# Patient Record
Sex: Female | Born: 1970 | Race: Asian | Hispanic: No | Marital: Married | Smoking: Never smoker
Health system: Southern US, Community
[De-identification: ages and names within clinical notes are randomized; demographics above are authoritative.]

## PROBLEM LIST (undated history)

## (undated) DIAGNOSIS — T7840XA Allergy, unspecified, initial encounter: Secondary | ICD-10-CM

## (undated) HISTORY — DX: Allergy, unspecified, initial encounter: T78.40XA

## (undated) HISTORY — PX: TUBAL LIGATION: SHX77

---

## 2001-09-16 ENCOUNTER — Other Ambulatory Visit: Admission: RE | Admit: 2001-09-16 | Discharge: 2001-09-16 | Payer: Self-pay | Admitting: Gynecology

## 2003-02-23 ENCOUNTER — Other Ambulatory Visit: Admission: RE | Admit: 2003-02-23 | Discharge: 2003-02-23 | Payer: Self-pay | Admitting: Gynecology

## 2003-07-04 ENCOUNTER — Encounter: Admission: RE | Admit: 2003-07-04 | Discharge: 2003-07-04 | Payer: Self-pay | Admitting: Gynecology

## 2003-09-17 ENCOUNTER — Inpatient Hospital Stay (HOSPITAL_COMMUNITY): Admission: AD | Admit: 2003-09-17 | Discharge: 2003-09-19 | Payer: Self-pay | Admitting: Gynecology

## 2003-11-01 ENCOUNTER — Other Ambulatory Visit: Admission: RE | Admit: 2003-11-01 | Discharge: 2003-11-01 | Payer: Self-pay | Admitting: Gynecology

## 2004-11-06 ENCOUNTER — Other Ambulatory Visit: Admission: RE | Admit: 2004-11-06 | Discharge: 2004-11-06 | Payer: Self-pay | Admitting: Gynecology

## 2005-06-11 ENCOUNTER — Other Ambulatory Visit: Admission: RE | Admit: 2005-06-11 | Discharge: 2005-06-11 | Payer: Self-pay | Admitting: Surgical Oncology

## 2005-09-23 ENCOUNTER — Encounter: Admission: RE | Admit: 2005-09-23 | Discharge: 2005-09-23 | Payer: Self-pay | Admitting: Gynecology

## 2005-12-30 ENCOUNTER — Inpatient Hospital Stay (HOSPITAL_COMMUNITY): Admission: AD | Admit: 2005-12-30 | Discharge: 2006-01-01 | Payer: Self-pay | Admitting: Gynecology

## 2006-02-10 ENCOUNTER — Other Ambulatory Visit: Admission: RE | Admit: 2006-02-10 | Discharge: 2006-02-10 | Payer: Self-pay | Admitting: Gynecology

## 2006-03-10 ENCOUNTER — Ambulatory Visit (HOSPITAL_BASED_OUTPATIENT_CLINIC_OR_DEPARTMENT_OTHER): Admission: RE | Admit: 2006-03-10 | Discharge: 2006-03-10 | Payer: Self-pay | Admitting: Gynecology

## 2014-05-10 ENCOUNTER — Ambulatory Visit (INDEPENDENT_AMBULATORY_CARE_PROVIDER_SITE_OTHER): Payer: BC Managed Care – PPO | Admitting: Women's Health

## 2014-05-10 ENCOUNTER — Encounter: Payer: Self-pay | Admitting: Women's Health

## 2014-05-10 ENCOUNTER — Other Ambulatory Visit (HOSPITAL_COMMUNITY)
Admission: RE | Admit: 2014-05-10 | Discharge: 2014-05-10 | Disposition: A | Discharge status: Home / Self Care | Payer: BC Managed Care – PPO | Source: Ambulatory Visit | Attending: Gynecology | Admitting: Gynecology

## 2014-05-10 VITALS — BP 120/82 | Ht 60.0 in | Wt 117.0 lb

## 2014-05-10 DIAGNOSIS — O24419 Gestational diabetes mellitus in pregnancy, unspecified control: Secondary | ICD-10-CM | POA: Insufficient documentation

## 2014-05-10 DIAGNOSIS — Z01419 Encounter for gynecological examination (general) (routine) without abnormal findings: Secondary | ICD-10-CM | POA: Insufficient documentation

## 2014-05-10 DIAGNOSIS — Z833 Family history of diabetes mellitus: Secondary | ICD-10-CM

## 2014-05-10 DIAGNOSIS — Z13220 Encounter for screening for lipoid disorders: Secondary | ICD-10-CM

## 2014-05-10 DIAGNOSIS — N912 Amenorrhea, unspecified: Secondary | ICD-10-CM

## 2014-05-10 LAB — LIPID PANEL
CHOLESTEROL: 318 mg/dL — AB (ref 0–200)
HDL: 53 mg/dL (ref >39)
LDL Cholesterol: 246 mg/dL — ABNORMAL HIGH (ref 0–99)
TRIGLYCERIDES: 93 mg/dL (ref <150)
Total CHOL/HDL Ratio: 6 Ratio
VLDL: 19 mg/dL (ref 0–40)

## 2014-05-10 LAB — CBC WITH DIFFERENTIAL/PLATELET
BASOS ABS: 0.1 10*3/uL (ref 0.0–0.1)
Basophils Relative: 1 % (ref 0–1)
Eosinophils Absolute: 0.5 10*3/uL (ref 0.0–0.7)
Eosinophils Relative: 8 % — ABNORMAL HIGH (ref 0–5)
HCT: 37.2 % (ref 36.0–46.0)
HEMOGLOBIN: 12.6 g/dL (ref 12.0–15.0)
LYMPHS PCT: 38 % (ref 12–46)
Lymphs Abs: 2.3 10*3/uL (ref 0.7–4.0)
MCH: 24 pg — ABNORMAL LOW (ref 26.0–34.0)
MCHC: 33.9 g/dL (ref 30.0–36.0)
MCV: 70.9 fL — ABNORMAL LOW (ref 78.0–100.0)
MONO ABS: 0.3 10*3/uL (ref 0.1–1.0)
MPV: 9.7 fL (ref 9.4–12.4)
Monocytes Relative: 5 % (ref 3–12)
NEUTROS ABS: 2.9 10*3/uL (ref 1.7–7.7)
NEUTROS PCT: 48 % (ref 43–77)
Platelets: 313 10*3/uL (ref 150–400)
RBC: 5.25 MIL/uL — ABNORMAL HIGH (ref 3.87–5.11)
RDW: 13.8 % (ref 11.5–15.5)
WBC: 6.1 10*3/uL (ref 4.0–10.5)

## 2014-05-10 LAB — GLUCOSE, RANDOM: Glucose, Bld: 85 mg/dL (ref 70–99)

## 2014-05-10 NOTE — Patient Instructions (Signed)

## 2014-05-10 NOTE — Addendum Note (Signed)
Addended by: Rushie GoltzSPANGLER, NICHOLE on: 05/10/2014 04:40 PM   Modules accepted: Orders

## 2014-05-10 NOTE — Progress Notes (Signed)
Sheri Hamilton 19-Nov-1970 [id number]    History:    Presents for annual exam.  Amenorrheic 2 years/BTL. Occasional hot flash. Last in our office 8 years ago, loss of insurance and numerous family responsibilities - has 4 children  between the ages of 19 and 45, 2 nephews adopted from [place] ages 80 and 12 that she has been caring for for many years also caring for elderly mother. Normal Paps. Has not had a mammogram. Gestational diabetes with one pregnancy.  Past medical history, past surgical history, family history and social history were all reviewed and documented in the EPIC chart. Part-time nails. Minimal history father, murdered in [place].  ROS:  A  12 point ROS was performed and pertinent positives and negatives are included.  Exam:  Filed Vitals:   05/10/14 0934  BP: 120/82    General appearance:  Normal Thyroid:  Symmetrical, normal in size, without palpable masses or nodularity. Respiratory  Auscultation:  Clear without wheezing or rhonchi Cardiovascular  Auscultation:  Regular rate, without rubs, murmurs or gallops  Edema/varicosities:  Not grossly evident Abdominal  Soft,nontender, without masses, guarding or rebound.  Liver/spleen:  No organomegaly noted  Hernia:  None appreciated  Skin  Inspection:  Grossly normal   Breasts: Examined lying and sitting.     Right: Without masses, retractions, discharge or axillary adenopathy.     Left: Without masses, retractions, discharge or axillary adenopathy. Gentitourinary   Inguinal/mons:  Normal without inguinal adenopathy  External genitalia:  Normal  BUS/Urethra/Skene's glands:  Normal  Vagina:  Normal  Cervix:  Normal  Uterus:  normal in size, shape and contour.  Midline and mobile  Adnexa/parametria:     Rt: Without masses or tenderness.   Lt: Without masses or tenderness.  Anus and perineum: Normal  Digital rectal exam: Normal sphincter tone without palpated masses or tenderness  Assessment/Plan:  43 y.o. MAF  G4P4 for annual exam.    Amenorrhea 2 years with minimal menopausal symptoms BTL Minimal healthcare for 8 years  Plan: Reviewed importance of screening annual mammogram, breast center information given and  instructed to schedule. SBE's, exercise, continue active lifestyle, calcium rich diet, vitamin D 1000 daily encouraged. CBC, lipid panel, glucose, FSH, UA, Pap.   Huel Cote WHNP, 11:10 AM 05/10/2014

## 2014-05-11 LAB — FOLLICLE STIMULATING HORMONE: FSH: 96 m[IU]/mL

## 2014-05-16 LAB — CYTOLOGY - PAP

## 2014-10-09 ENCOUNTER — Telehealth: Payer: Self-pay | Admitting: *Deleted

## 2014-10-09 NOTE — Telephone Encounter (Signed)
Pt called c/o irregular cycle, transferred to front desk.

## 2014-10-13 ENCOUNTER — Ambulatory Visit (INDEPENDENT_AMBULATORY_CARE_PROVIDER_SITE_OTHER): Payer: BLUE CROSS/BLUE SHIELD

## 2014-10-13 ENCOUNTER — Encounter: Payer: Self-pay | Admitting: Women's Health

## 2014-10-13 ENCOUNTER — Ambulatory Visit (INDEPENDENT_AMBULATORY_CARE_PROVIDER_SITE_OTHER): Payer: BLUE CROSS/BLUE SHIELD | Admitting: Women's Health

## 2014-10-13 VITALS — BP 126/80 | Ht 60.0 in | Wt 117.0 lb

## 2014-10-13 DIAGNOSIS — N950 Postmenopausal bleeding: Secondary | ICD-10-CM

## 2014-10-13 NOTE — Progress Notes (Signed)
Patient ID: Sheri Hamilton, female   DOB: 03/02/1971, 44 y.o.   MRN: [id number] Presents with complaint of 6 day "regular" menstrual cycle last week. Has not had any bleeding for greater than 2 years. BTL. 04/2014 FSH 96. No HRT, minimal hot flushes. States would like to have a monthly cycle.  Exam: Appears well. Ultrasound: Transvaginal uterus retroverted homogeneous. Endometrium within normal limits. Endometrium 5.3 mm. Right left ovary normal. Negative cul-de-sac. No apparent mass right or left adnexal.  Early menopause/Postmenopausal bleeding/normal endometrium  Plan: Recheck FSH, reviewed would need to use HRT to have monthly cycle, states does not want to use HRT. Reviewed most likely one time event of postmenopausal bleeding. Instructed to call if any further bleeding.

## 2015-06-12 ENCOUNTER — Ambulatory Visit (INDEPENDENT_AMBULATORY_CARE_PROVIDER_SITE_OTHER): Payer: BLUE CROSS/BLUE SHIELD | Admitting: Physician Assistant

## 2015-06-12 VITALS — BP 139/89 | HR 79 | Temp 98.5°F | Resp 16 | Ht 59.0 in | Wt 116.0 lb

## 2015-06-12 DIAGNOSIS — J302 Other seasonal allergic rhinitis: Secondary | ICD-10-CM | POA: Diagnosis not present

## 2015-06-12 DIAGNOSIS — R05 Cough: Secondary | ICD-10-CM | POA: Diagnosis not present

## 2015-06-12 DIAGNOSIS — R059 Cough, unspecified: Secondary | ICD-10-CM

## 2015-06-12 MED ORDER — IPRATROPIUM BROMIDE 0.02 % IN SOLN: 0.5000 mg | Freq: Once | RESPIRATORY_TRACT | Status: DC

## 2015-06-12 MED ORDER — ALBUTEROL SULFATE (2.5 MG/3ML) 0.083% IN NEBU: 2.5000 mg | INHALATION_SOLUTION | Freq: Once | RESPIRATORY_TRACT | Status: DC

## 2015-06-12 MED ORDER — HYDROCODONE-HOMATROPINE 5-1.5 MG/5ML PO SYRP: 2.5000 mL | ORAL_SOLUTION | Freq: Every evening | ORAL | Status: DC | PRN

## 2015-06-12 MED ORDER — LORATADINE-PSEUDOEPHEDRINE ER 10-240 MG PO TB24: 1.0000 | ORAL_TABLET | Freq: Every day | ORAL | Status: DC

## 2015-06-12 NOTE — Patient Instructions (Signed)
Take Zyrtec in the future when your symptoms return.

## 2015-06-12 NOTE — Progress Notes (Signed)
   06/13/2015 12:47 AM   DOB: 09/03/1970 / MRN: [id number]  SUBJECTIVE:  Sheri Hamilton is a 44 y.o. female presenting for cough that has been present for three weeks.  Reports the cough is worse at night.  Reports her chest is hurting from coughing.  Denies shortness of breath unless she is breathing through her nose. Also complains of sneezing, itchy eyes and itchy throat.   She has No Known Allergies.   She  has a past medical history of Allergy.    She  reports that she has never smoked. She has never used smokeless tobacco. She reports that she does not drink alcohol or use illicit drugs. She  reports that she currently engages in sexual activity. The patient  has past surgical history that includes Tubal ligation.  Her family history is not on file.  Review of Systems  Constitutional: Negative for fever, chills, malaise/fatigue and diaphoresis.  HENT: Positive for congestion. Negative for sore throat.   Eyes: Positive for redness.  Respiratory: Positive for cough. Negative for hemoptysis, shortness of breath and wheezing.   Cardiovascular: Negative for chest pain.  Gastrointestinal: Negative for nausea.  Skin: Negative for rash.  Neurological: Negative for dizziness and weakness.  Endo/Heme/Allergies: Negative for polydipsia.    Problem list and medications reviewed and updated by myself where necessary, and exist elsewhere in the encounter.   OBJECTIVE:  BP 139/89 mmHg  Pulse 79  Temp(Src) 98.5 F (36.9 C) (Oral)  Resp 16  Ht 4\' 11"  (1.499 m)  Wt 116 lb (52.617 kg)  BMI 23.42 kg/m2  SpO2 97%  LMP 08/30/2014  Physical Exam  Constitutional: She is oriented to person, place, and time. She appears well-developed and well-nourished. No distress.  HENT:  Nose: Mucosal edema (severe, bilateral, pale) present.  Eyes: EOM are normal. Pupils are equal, round, and reactive to light.  Cardiovascular: Normal rate and regular rhythm.   Pulmonary/Chest: Effort normal and breath sounds  normal. She has no wheezes.  Abdominal: She exhibits no distension.  Neurological: She is alert and oriented to person, place, and time. No cranial nerve deficit. Gait normal.  Skin: Skin is dry. She is not diaphoretic.  Psychiatric: She has a normal mood and affect.  Vitals reviewed.   No results found for this or any previous visit (from the past 48 hour(s)).  ASSESSMENT AND PLAN  Sheri Hamilton was seen today for cough, nasal congestion, chest sore and menstral question.  Diagnoses and all orders for this visit:  Cough -     Discontinue: albuterol (PROVENTIL) (2.5 MG/3ML) 0.083% nebulizer solution 2.5 mg; Take 3 mLs (2.5 mg total) by nebulization once. -     Discontinue: ipratropium (ATROVENT) nebulizer solution 0.5 mg; Take 2.5 mLs (0.5 mg total) by nebulization once. -     HYDROcodone-homatropine (HYCODAN) 5-1.5 MG/5ML syrup; Take 2.5-5 mLs by mouth at bedtime as needed.  Seasonal allergies -     loratadine-pseudoephedrine (CLARITIN-D 24 HOUR) 10-240 MG 24 hr tablet; Take 1 tablet by mouth daily.    The patient was advised to call or return to clinic if she does not see an improvement in symptoms or to seek the care of the closest emergency department if she worsens with the above plan.   Deliah BostonMichael Laiza Veenstra, MHS, PA-C [facility] 06/13/2015 12:47 AM

## 2016-05-22 ENCOUNTER — Ambulatory Visit (INDEPENDENT_AMBULATORY_CARE_PROVIDER_SITE_OTHER): Payer: BLUE CROSS/BLUE SHIELD | Admitting: Physician Assistant

## 2016-05-22 VITALS — BP 126/80 | HR 96 | Temp 98.9°F | Resp 18 | Ht 59.0 in | Wt 118.0 lb

## 2016-05-22 DIAGNOSIS — J988 Other specified respiratory disorders: Secondary | ICD-10-CM | POA: Diagnosis not present

## 2016-05-22 DIAGNOSIS — B9789 Other viral agents as the cause of diseases classified elsewhere: Secondary | ICD-10-CM

## 2016-05-22 DIAGNOSIS — R062 Wheezing: Secondary | ICD-10-CM | POA: Diagnosis not present

## 2016-05-22 DIAGNOSIS — R058 Other specified cough: Secondary | ICD-10-CM

## 2016-05-22 DIAGNOSIS — R05 Cough: Secondary | ICD-10-CM | POA: Diagnosis not present

## 2016-05-22 LAB — POCT CBC
GRANULOCYTE PERCENT: 75.1 % (ref 37–80)
HCT, POC: 38.8 % (ref 37.7–47.9)
Hemoglobin: 13.2 g/dL (ref 12.2–16.2)
Lymph, poc: 1.6 (ref 0.6–3.4)
MCH: 25.1 pg — AB (ref 27–31.2)
MCHC: 34 g/dL (ref 31.8–35.4)
MCV: 74 fL — AB (ref 80–97)
MID (cbc): 0.5 (ref 0–0.9)
MPV: 7.7 fL (ref 0–99.8)
PLATELET COUNT, POC: 246 10*3/uL (ref 142–424)
POC Granulocyte: 6.2 (ref 2–6.9)
POC LYMPH PERCENT: 18.9 %L (ref 10–50)
POC MID %: 6 %M (ref 0–12)
RBC: 5.24 M/uL (ref 4.04–5.48)
RDW, POC: 13.5 %
WBC: 8.3 10*3/uL (ref 4.6–10.2)

## 2016-05-22 LAB — POCT INFLUENZA A/B
INFLUENZA A, POC: NEGATIVE
INFLUENZA B, POC: NEGATIVE

## 2016-05-22 MED ORDER — FLUTICASONE PROPIONATE 50 MCG/ACT NA SUSP: 2.0000 | Freq: Every day | NASAL | 12 refills | Status: DC

## 2016-05-22 MED ORDER — GUAIFENESIN ER 1200 MG PO TB12: 1.0000 | ORAL_TABLET | Freq: Two times a day (BID) | ORAL | 1 refills | Status: DC | PRN

## 2016-05-22 MED ORDER — IBUPROFEN 600 MG PO TABS: 600.0000 mg | ORAL_TABLET | Freq: Four times a day (QID) | ORAL | 0 refills | Status: DC | PRN

## 2016-05-22 MED ORDER — ALBUTEROL SULFATE HFA 108 (90 BASE) MCG/ACT IN AERS: 2.0000 | INHALATION_SPRAY | RESPIRATORY_TRACT | 1 refills | Status: DC | PRN

## 2016-05-22 MED ORDER — IPRATROPIUM BROMIDE 0.02 % IN SOLN
0.5000 mg | Freq: Once | RESPIRATORY_TRACT | Status: AC
  Administered 2016-05-22: 0.5 mg via RESPIRATORY_TRACT

## 2016-05-22 MED ORDER — ALBUTEROL SULFATE (2.5 MG/3ML) 0.083% IN NEBU
2.5000 mg | INHALATION_SOLUTION | Freq: Once | RESPIRATORY_TRACT | Status: AC
  Administered 2016-05-22: 2.5 mg via RESPIRATORY_TRACT

## 2016-05-22 MED ORDER — BENZONATATE 100 MG PO CAPS: 100.0000 mg | ORAL_CAPSULE | Freq: Three times a day (TID) | ORAL | 0 refills | Status: DC | PRN

## 2016-05-22 NOTE — Progress Notes (Signed)
[facility] [address], [place] [address] [phone]  Date:  05/22/2016   Name:  Sheri Hamilton   DOB:  03/31/1971   MRN:  [id number]  PCP:  No primary care provider on file.    History of Present Illness:  Sheri Hamilton is a 45 y.o. female patient who [facility] cc of wheezing and cough. Patient reports about 3 days of coughing that is non-productive.  She has wheezing and nasal congesiton with post-nasal drip.  No fevers.  No ear pain.  Mild pain of the throat.  She has no hx of asthma.  No watery eyes or sneezing.      Patient Active Problem List   Diagnosis Date Noted  . GDM (gestational diabetes mellitus) 05/10/2014    Past Medical History:  Diagnosis Date  . Allergy     Past Surgical History:  Procedure Laterality Date  . TUBAL LIGATION      Social History  Substance Use Topics  . Smoking status: Never Smoker  . Smokeless tobacco: Never Used  . Alcohol use No    History reviewed. No pertinent family history.  No Known Allergies  Medication list has been reviewed and updated.  Current Outpatient Prescriptions on File Prior to Visit  Medication Sig Dispense Refill  . loratadine-pseudoephedrine (CLARITIN-D 24 HOUR) 10-240 MG 24 hr tablet Take 1 tablet by mouth daily. 14 tablet 0   No current facility-administered medications on file prior to visit.     ROS   Physical Examination: BP 126/80 (BP Location: Right Arm, Patient Position: Sitting, Cuff Size: Small)   Pulse 96   Temp 98.9 F (37.2 C) (Oral)   Resp 18   Ht 4\' 11"  (1.499 m)   Wt 118 lb (53.5 kg)   LMP 08/30/2014   SpO2 97%   BMI 23.83 kg/m  Ideal Body Weight: Weight in (lb) to have BMI = 25: 123.5  Physical Exam  Constitutional: She is oriented to person, place, and time. She appears well-developed and well-nourished. No distress.  HENT:  Head: Normocephalic and atraumatic.  Right Ear: Tympanic membrane, external ear and ear canal normal.  Left Ear:  Tympanic membrane, external ear and ear canal normal.  Nose: Mucosal edema and rhinorrhea present. Right sinus exhibits no maxillary sinus tenderness and no frontal sinus tenderness. Left sinus exhibits no maxillary sinus tenderness and no frontal sinus tenderness.  Mouth/Throat: No uvula swelling. No oropharyngeal exudate, posterior oropharyngeal edema or posterior oropharyngeal erythema.  Eyes: Conjunctivae and EOM are normal. Pupils are equal, round, and reactive to light.  Cardiovascular: Normal rate and regular rhythm.  Exam reveals no gallop, no distant heart sounds and no friction rub.   No murmur heard. Pulmonary/Chest: Effort normal. No respiratory distress. She has no decreased breath sounds. She has wheezes. She has no rhonchi.  Lymphadenopathy:       Head (right side): No submandibular, no tonsillar, no preauricular and no posterior auricular adenopathy present.       Head (left side): No submandibular, no tonsillar, no preauricular and no posterior auricular adenopathy present.  Neurological: She is alert and oriented to person, place, and time.  Skin: She is not diaphoretic.  Psychiatric: She has a normal mood and affect. Her behavior is normal.   Results for orders placed or performed in visit on 05/22/16  POCT CBC  Result Value Ref Range   WBC 8.3 4.6 - 10.2 K/uL   Lymph, poc 1.6 0.6 - 3.4  POC LYMPH PERCENT 18.9 10 - 50 %L   MID (cbc) 0.5 0 - 0.9   POC MID % 6.0 0 - 12 %M   POC Granulocyte 6.2 2 - 6.9   Granulocyte percent 75.1 37 - 80 %G   RBC 5.24 4.04 - 5.48 M/uL   Hemoglobin 13.2 12.2 - 16.2 g/dL   HCT, POC 69.638.8 29.537.7 - 47.9 %   MCV 74.0 (A) 80 - 97 fL   MCH, POC 25.1 (A) 27 - 31.2 pg   MCHC 34.0 31.8 - 35.4 g/dL   RDW, POC 28.413.5 %   Platelet Count, POC 246 142 - 424 K/uL   MPV 7.7 0 - 99.8 fL  POCT Influenza A/B  Result Value Ref Range   Influenza A, POC Negative Negative   Influenza B, POC Negative Negative      Assessment and Plan: Sheri Hamilton is a 45  y.o. female who is here today for cc of cough and wheezing.  This appears to be a viral uri. Given albuterol and supportive treatment.  Some improvement with the nebulizer, she reports.   If symptoms do not improve and she contacts after 4 days, please give her azithromycin. Wheezing - Plan: POCT CBC, POCT Influenza A/B, ibuprofen (ADVIL,MOTRIN) 600 MG tablet, albuterol (PROVENTIL) (2.5 MG/3ML) 0.083% nebulizer solution 2.5 mg, ipratropium (ATROVENT) nebulizer solution 0.5 mg, Guaifenesin (MUCINEX MAXIMUM STRENGTH) 1200 MG TB12, albuterol (PROVENTIL HFA;VENTOLIN HFA) 108 (90 Base) MCG/ACT inhaler, fluticasone (FLONASE) 50 MCG/ACT nasal spray, benzonatate (TESSALON) 100 MG capsule  Productive cough - Plan: POCT CBC, POCT Influenza A/B, ibuprofen (ADVIL,MOTRIN) 600 MG tablet, albuterol (PROVENTIL) (2.5 MG/3ML) 0.083% nebulizer solution 2.5 mg, ipratropium (ATROVENT) nebulizer solution 0.5 mg, Guaifenesin (MUCINEX MAXIMUM STRENGTH) 1200 MG TB12, albuterol (PROVENTIL HFA;VENTOLIN HFA) 108 (90 Base) MCG/ACT inhaler, fluticasone (FLONASE) 50 MCG/ACT nasal spray, benzonatate (TESSALON) 100 MG capsule  Trena PlattStephanie English, PA-C [facility] 12/11/20179:18 AM  There are no diagnoses linked to this encounter.  Trena PlattStephanie English, PA-C [facility] 05/22/2016 2:22 PM

## 2016-05-22 NOTE — Patient Instructions (Addendum)
Please let me know if your symptoms do not improve.  Please take medication as prescribed.    Viral Respiratory Infection Introduction A viral respiratory infection is an illness that affects parts of the body used for breathing, like the lungs, nose, and throat. It is caused by a germ called a virus. Some examples of this kind of infection are:  A cold.  The flu (influenza).  A respiratory syncytial virus (RSV) infection. How do I know if I have this infection? Most of the time this infection causes:  A stuffy or runny nose.  Yellow or green fluid in the nose.  A cough.  Sneezing.  Tiredness (fatigue).  Achy muscles.  A sore throat.  Sweating or chills.  A fever.  A headache. How is this infection treated? If the flu is diagnosed early, it may be treated with an antiviral medicine. This medicine shortens the length of time a person has symptoms. Symptoms may be treated with over-the-counter and prescription medicines, such as:  Expectorants. These make it easier to cough up mucus.  Decongestant nasal sprays. Doctors do not prescribe antibiotic medicines for viral infections. They do not work with this kind of infection. How do I know if I should stay home? To keep others from getting sick, stay home if you have:  A fever.  A lasting cough.  A sore throat.  A runny nose.  Sneezing.  Muscles aches.  Headaches.  Tiredness.  Weakness.  Chills.  Sweating.  An upset stomach (nausea). Follow these instructions at home:  Rest as much as possible.  Take over-the-counter and prescription medicines only as told by your doctor.  Drink enough fluid to keep your pee (urine) clear or pale yellow.  Gargle with salt water. Do this 3-4 times per day or as needed. To make a salt-water mixture, dissolve -1 tsp of salt in 1 cup of warm water. Make sure the salt dissolves all the way.  Use nose drops made from salt water. This helps with stuffiness  (congestion). It also helps soften the skin around your nose.  Do not drink alcohol.  Do not use tobacco products, including cigarettes, chewing tobacco, and e-cigarettes. If you need help quitting, ask your doctor. Get help if:  Your symptoms last for 10 days or longer.  Your symptoms get worse over time.  You have a fever.  You have very bad pain in your face or forehead.  Parts of your jaw or neck become very swollen. Get help right away if:  You feel pain or pressure in your chest.  You have shortness of breath.  You faint or feel like you will faint.  You keep throwing up (vomiting).  You feel confused. This information is not intended to replace advice given to you by your health care provider. Make sure you discuss any questions you have with your health care provider. Document Released: 05/15/2008 Document Revised: 11/08/2015 Document Reviewed: 11/08/2014  2017 Elsevier   IF you received an x-ray today, you will receive an invoice from [facility]. Please contact [facility] at [phone] with questions or concerns regarding your invoice.   IF you received labwork today, you will receive an invoice from [facility]. Please contact [facility] at [phone] with questions or concerns regarding your invoice.   Our billing staff will not be able to assist you with questions regarding bills from these companies.  You will be contacted with the lab results as soon as they are available. The  fastest way to get your results is to activate your My Chart account. Instructions are located on the last page of this paperwork. If you have not heard from [place] regarding the results in 2 weeks, please contact this office.

## 2016-10-29 ENCOUNTER — Encounter: Payer: Self-pay | Admitting: Gynecology

## 2017-08-19 ENCOUNTER — Ambulatory Visit: Payer: BLUE CROSS/BLUE SHIELD | Admitting: Urgent Care

## 2017-08-19 ENCOUNTER — Ambulatory Visit (INDEPENDENT_AMBULATORY_CARE_PROVIDER_SITE_OTHER): Payer: BLUE CROSS/BLUE SHIELD

## 2017-08-19 ENCOUNTER — Encounter: Payer: Self-pay | Admitting: Urgent Care

## 2017-08-19 VITALS — BP 128/92 | HR 94 | Temp 98.5°F | Resp 16 | Ht 59.0 in | Wt 119.2 lb

## 2017-08-19 DIAGNOSIS — R05 Cough: Secondary | ICD-10-CM | POA: Diagnosis not present

## 2017-08-19 DIAGNOSIS — R079 Chest pain, unspecified: Secondary | ICD-10-CM | POA: Diagnosis not present

## 2017-08-19 DIAGNOSIS — J209 Acute bronchitis, unspecified: Secondary | ICD-10-CM

## 2017-08-19 DIAGNOSIS — R062 Wheezing: Secondary | ICD-10-CM

## 2017-08-19 DIAGNOSIS — R058 Other specified cough: Secondary | ICD-10-CM

## 2017-08-19 DIAGNOSIS — R0789 Other chest pain: Secondary | ICD-10-CM

## 2017-08-19 MED ORDER — AZITHROMYCIN 250 MG PO TABS: ORAL_TABLET | ORAL | 0 refills | Status: AC

## 2017-08-19 MED ORDER — PREDNISONE 20 MG PO TABS: ORAL_TABLET | ORAL | 0 refills | Status: AC

## 2017-08-19 MED ORDER — BENZONATATE 100 MG PO CAPS
100.0000 mg | ORAL_CAPSULE | Freq: Three times a day (TID) | ORAL | 0 refills | Status: AC | PRN
Start: 2017-08-19 — End: ?

## 2017-08-19 MED ORDER — HYDROCODONE-HOMATROPINE 5-1.5 MG/5ML PO SYRP: 5.0000 mL | ORAL_SOLUTION | Freq: Every evening | ORAL | 0 refills | Status: AC | PRN

## 2017-08-19 MED ORDER — ALBUTEROL SULFATE HFA 108 (90 BASE) MCG/ACT IN AERS: 2.0000 | INHALATION_SPRAY | Freq: Four times a day (QID) | RESPIRATORY_TRACT | 1 refills | Status: AC | PRN

## 2017-08-19 NOTE — Patient Instructions (Addendum)
Acute Bronchitis, Adult Acute bronchitis is sudden (acute) swelling of the air tubes (bronchi) in the lungs. Acute bronchitis causes these tubes to fill with mucus, which can make it hard to breathe. It can also cause coughing or wheezing. In adults, acute bronchitis usually goes away within 2 weeks. A cough caused by bronchitis may last up to 3 weeks. Smoking, allergies, and asthma can make the condition worse. Repeated episodes of bronchitis may cause further lung problems, such as chronic obstructive pulmonary disease (COPD). What are the causes? This condition can be caused by germs and by substances that irritate the lungs, including:  Cold and flu viruses. This condition is most often caused by the same virus that causes a cold.  Bacteria.  Exposure to tobacco smoke, dust, fumes, and air pollution.  What increases the risk? This condition is more likely to develop in people who:  Have close contact with someone with acute bronchitis.  Are exposed to lung irritants, such as tobacco smoke, dust, fumes, and vapors.  Have a weak immune system.  Have a respiratory condition such as asthma.  What are the signs or symptoms? Symptoms of this condition include:  A cough.  Coughing up clear, yellow, or green mucus.  Wheezing.  Chest congestion.  Shortness of breath.  A fever.  Body aches.  Chills.  A sore throat.  How is this diagnosed? This condition is usually diagnosed with a physical exam. During the exam, your health care provider may order tests, such as chest X-rays, to rule out other conditions. He or she may also:  Test a sample of your mucus for bacterial infection.  Check the level of oxygen in your blood. This is done to check for pneumonia.  Do a chest X-ray or lung function testing to rule out pneumonia and other conditions.  Perform blood tests.  Your health care provider will also ask about your symptoms and medical history. How is this  treated? Most cases of acute bronchitis clear up over time without treatment. Your health care provider may recommend:  Drinking more fluids. Drinking more makes your mucus thinner, which may make it easier to breathe.  Taking a medicine for a fever or cough.  Taking an antibiotic medicine.  Using an inhaler to help improve shortness of breath and to control a cough.  Using a cool mist vaporizer or humidifier to make it easier to breathe.  Follow these instructions at home: Medicines  Take over-the-counter and prescription medicines only as told by your health care provider.  If you were prescribed an antibiotic, take it as told by your health care provider. Do not stop taking the antibiotic even if you start to feel better. General instructions  Get plenty of rest.  Drink enough fluids to keep your urine clear or pale yellow.  Avoid smoking and secondhand smoke. Exposure to cigarette smoke or irritating chemicals will make bronchitis worse. If you smoke and you need help quitting, ask your health care provider. Quitting smoking will help your lungs heal faster.  Use an inhaler, cool mist vaporizer, or humidifier as told by your health care provider.  Keep all follow-up visits as told by your health care provider. This is important. How is this prevented? To lower your risk of getting this condition again:  Wash your hands often with soap and water. If soap and water are not available, use hand sanitizer.  Avoid contact with people who have cold symptoms.  Try not to touch your hands to your   mouth, nose, or eyes.  Make sure to get the flu shot every year.  Contact a health care provider if:  Your symptoms do not improve in 2 weeks of treatment. Get help right away if:  You cough up blood.  You have chest pain.  You have severe shortness of breath.  You become dehydrated.  You faint or keep feeling like you are going to faint.  You keep vomiting.  You have a  severe headache.  Your fever or chills gets worse. This information is not intended to replace advice given to you by your health care provider. Make sure you discuss any questions you have with your health care provider. Document Released: 07/10/2004 Document Revised: 12/26/2015 Document Reviewed: 11/21/2015 Elsevier Interactive Patient Education  2018 Elsevier Inc.      IF you received an x-ray today, you will receive an invoice from [facility]. Please contact [facility] at [phone] with questions or concerns regarding your invoice.   IF you received labwork today, you will receive an invoice from [facility]. Please contact [facility] at [phone] with questions or concerns regarding your invoice.   Our billing staff will not be able to assist you with questions regarding bills from these companies.  You will be contacted with the lab results as soon as they are available. The fastest way to get your results is to activate your My Chart account. Instructions are located on the last page of this paperwork. If you have not heard from us regarding the results in 2 weeks, please contact this office.      

## 2017-08-19 NOTE — Progress Notes (Signed)
   MRN: [id number] DOB: 12/24/1970  Subjective:   Sheri Hamilton is a 47 y.o. female presenting for 6 day history of productive cough that elicits chest pain, shob, sore throat. Has also had chills, post-tussive emesis, decreased appetite, fatigue. Has tried APAP with minimal relief. Denies fever, sinus pain, sinus congestion, ear pain, abdominal pain. Denies smoking cigarettes.  Sheri Hamilton is not currently taking any medications and has No Known Allergies.  Sheri Hamilton  has a past medical history of Allergy. Also  has a past surgical history that includes Tubal ligation.  Objective:   Vitals: BP (!) 128/92   Pulse 94   Temp 98.5 F (36.9 C) (Oral)   Resp 16   Ht 4\' 11"  (1.499 m)   Wt 119 lb 3.2 oz (54.1 kg)   LMP 08/30/2014   SpO2 94%   BMI 24.08 kg/m   Physical Exam  Constitutional: She is oriented to person, place, and time. She appears well-developed and well-nourished.  HENT:  Mouth/Throat: Oropharynx is clear and moist.  Eyes: Right eye exhibits no discharge. Left eye exhibits no discharge. No scleral icterus.  Neck: Normal range of motion. Neck supple.  Cardiovascular: Normal rate, regular rhythm and intact distal pulses. Exam reveals no gallop and no friction rub.  No murmur heard. Pulmonary/Chest: No respiratory distress. She has no wheezes. She has no rales.  Rhonchi throughout.  Lymphadenopathy:    She has no cervical adenopathy.  Neurological: She is alert and oriented to person, place, and time.  Skin: Skin is warm and dry.  Psychiatric: She has a normal mood and affect.   Dg Chest 2 View  Result Date: 08/19/2017 CLINICAL DATA:  47 year old female with cough chest pain and wheezing for 6 days. EXAM: CHEST - 2 VIEW COMPARISON:  None. FINDINGS: Normal lung volumes. Mediastinal contours are within normal limits. Visualized tracheal air column is within normal limits. Both lungs appear clear. No pneumothorax or pleural effusion. No osseous abnormality identified. Negative visible  bowel gas pattern. IMPRESSION: Negative.  No acute cardiopulmonary abnormality. Electronically Signed   By: Odessa FlemingH  Hall M.D.   On: 08/19/2017 15:36     Assessment and Plan :   Acute bronchitis, unspecified organism - Plan: DG Chest 2 View  Productive cough - Plan: albuterol (PROVENTIL HFA;VENTOLIN HFA) 108 (90 Base) MCG/ACT inhaler, DG Chest 2 View  Wheezing - Plan: albuterol (PROVENTIL HFA;VENTOLIN HFA) 108 (90 Base) MCG/ACT inhaler, DG Chest 2 View  Atypical chest pain - Plan: DG Chest 2 View  Start azithromycin, prednisone course. Use supportive care and cough suppression medications. Return-to-clinic precautions discussed, patient verbalized understanding.   Wallis BambergMario Render Marley, PA-C [facility] [phone] 08/19/2017  3:09 PM

## 2018-03-16 DIAGNOSIS — Z23 Encounter for immunization: Secondary | ICD-10-CM | POA: Diagnosis not present

## 2018-09-11 IMAGING — DX DG CHEST 2V
2 series · 2 of 2 positions shown · non-contrast
Comparison: None.

CLINICAL DATA: 46-year-old female with cough chest pain and
wheezing for 6 days.

EXAM:
CHEST - 2 VIEW

[chest pa]
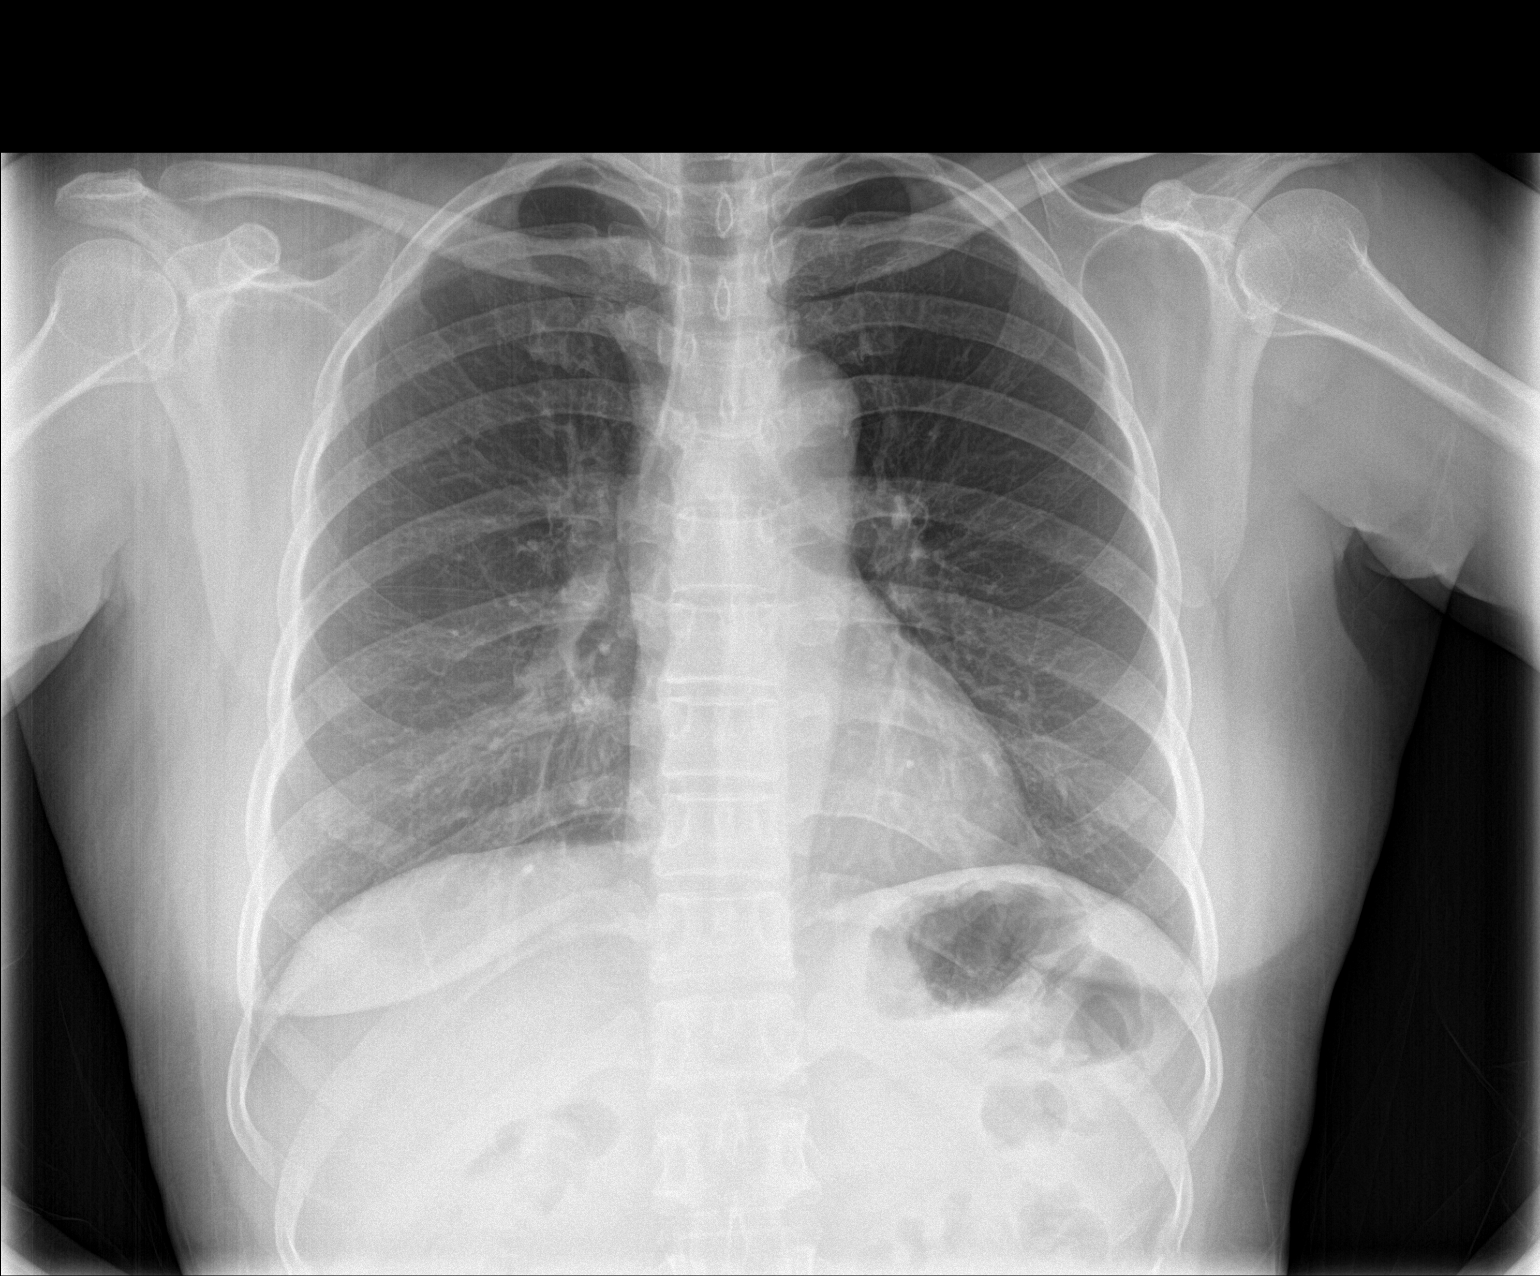

[chest lat]
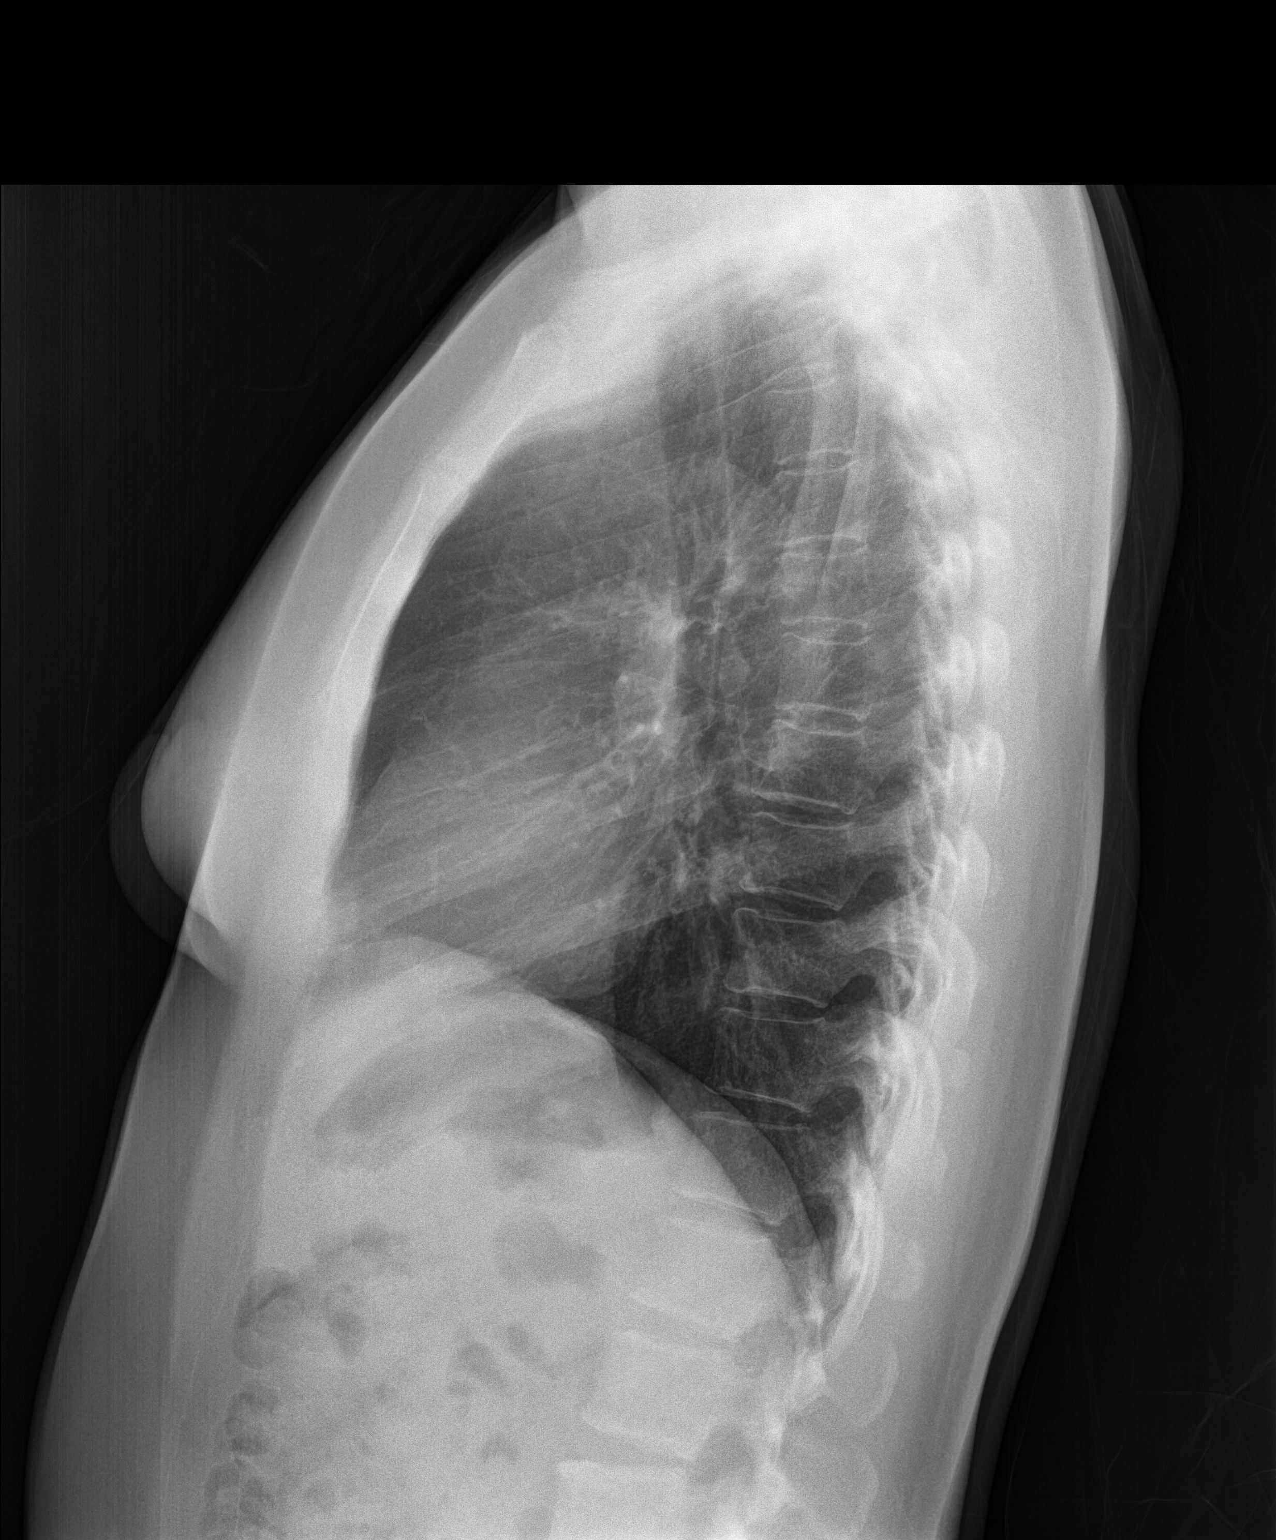

[2 of 2 positions shown; findings below may reference images not displayed]

FINDINGS: Normal lung volumes. Mediastinal contours are within normal limits.
Visualized tracheal air column is within normal limits. Both lungs
appear clear. No pneumothorax or pleural effusion. No osseous
abnormality identified. Negative visible bowel gas pattern.
IMPRESSION: Negative.  No acute cardiopulmonary abnormality.
